# Patient Record
Sex: Female | Born: 1970
Health system: Southern US, Community
[De-identification: ages and names within clinical notes are randomized; demographics above are authoritative.]

---

## 1999-02-20 ENCOUNTER — Emergency Department (HOSPITAL_COMMUNITY): Admission: EM | Admit: 1999-02-20 | Discharge: 1999-02-20 | Payer: Self-pay | Admitting: Emergency Medicine

## 2001-10-06 ENCOUNTER — Encounter: Payer: Self-pay | Admitting: Emergency Medicine

## 2001-10-06 ENCOUNTER — Encounter: Admission: RE | Admit: 2001-10-06 | Discharge: 2001-10-06 | Payer: Self-pay | Admitting: Emergency Medicine

## 2003-08-17 ENCOUNTER — Emergency Department (HOSPITAL_COMMUNITY): Admission: EM | Admit: 2003-08-17 | Discharge: 2003-08-18 | Payer: Self-pay | Admitting: Emergency Medicine

## 2003-08-18 ENCOUNTER — Encounter: Payer: Self-pay | Admitting: Emergency Medicine

## 2008-12-14 ENCOUNTER — Encounter (INDEPENDENT_AMBULATORY_CARE_PROVIDER_SITE_OTHER): Payer: Self-pay | Admitting: Emergency Medicine

## 2008-12-14 ENCOUNTER — Emergency Department (HOSPITAL_COMMUNITY): Admission: EM | Admit: 2008-12-14 | Discharge: 2008-12-14 | Payer: Self-pay | Admitting: Emergency Medicine

## 2008-12-14 ENCOUNTER — Ambulatory Visit: Payer: Self-pay | Admitting: Vascular Surgery

## 2009-11-13 ENCOUNTER — Emergency Department (HOSPITAL_COMMUNITY): Admission: EM | Admit: 2009-11-13 | Discharge: 2009-11-13 | Payer: Self-pay | Admitting: Family Medicine

## 2011-02-20 LAB — POCT I-STAT, CHEM 8
BUN: 10 mg/dL (ref 6–23)
Calcium, Ion: 1.22 mmol/L (ref 1.12–1.32)
Chloride: 106 mEq/L (ref 96–112)
Creatinine, Ser: 0.9 mg/dL (ref 0.4–1.2)
Glucose, Bld: 88 mg/dL (ref 70–99)
HCT: 45 % (ref 36.0–46.0)
Hemoglobin: 15.3 g/dL — ABNORMAL HIGH (ref 12.0–15.0)
Potassium: 3.7 mEq/L (ref 3.5–5.1)
Sodium: 143 mEq/L (ref 135–145)
TCO2: 28 mmol/L (ref 0–100)

## 2012-02-26 ENCOUNTER — Other Ambulatory Visit (HOSPITAL_COMMUNITY): Payer: Self-pay | Admitting: Obstetrics & Gynecology

## 2012-02-26 DIAGNOSIS — Z1231 Encounter for screening mammogram for malignant neoplasm of breast: Secondary | ICD-10-CM

## 2012-03-24 ENCOUNTER — Other Ambulatory Visit (HOSPITAL_COMMUNITY): Payer: Self-pay | Admitting: Family Medicine

## 2012-03-24 ENCOUNTER — Other Ambulatory Visit: Payer: Self-pay | Admitting: Internal Medicine

## 2012-03-24 ENCOUNTER — Ambulatory Visit (HOSPITAL_COMMUNITY)
Admission: RE | Admit: 2012-03-24 | Discharge: 2012-03-24 | Disposition: A | Discharge status: Home / Self Care | Payer: Self-pay | Source: Ambulatory Visit | Attending: Obstetrics & Gynecology | Admitting: Obstetrics & Gynecology

## 2012-03-24 DIAGNOSIS — Z1231 Encounter for screening mammogram for malignant neoplasm of breast: Secondary | ICD-10-CM

## 2012-11-26 IMAGING — MG MM DIGITAL SCREENING BILAT W/ CAD
5 series · 5 of 5 positions shown · non-contrast
Comparison: none

CLINICAL DATA: Screening.

MAMMOGRAPHIC BILATERAL DIGITAL SCREENING WITH CAD

[R CC (1 of 2)]
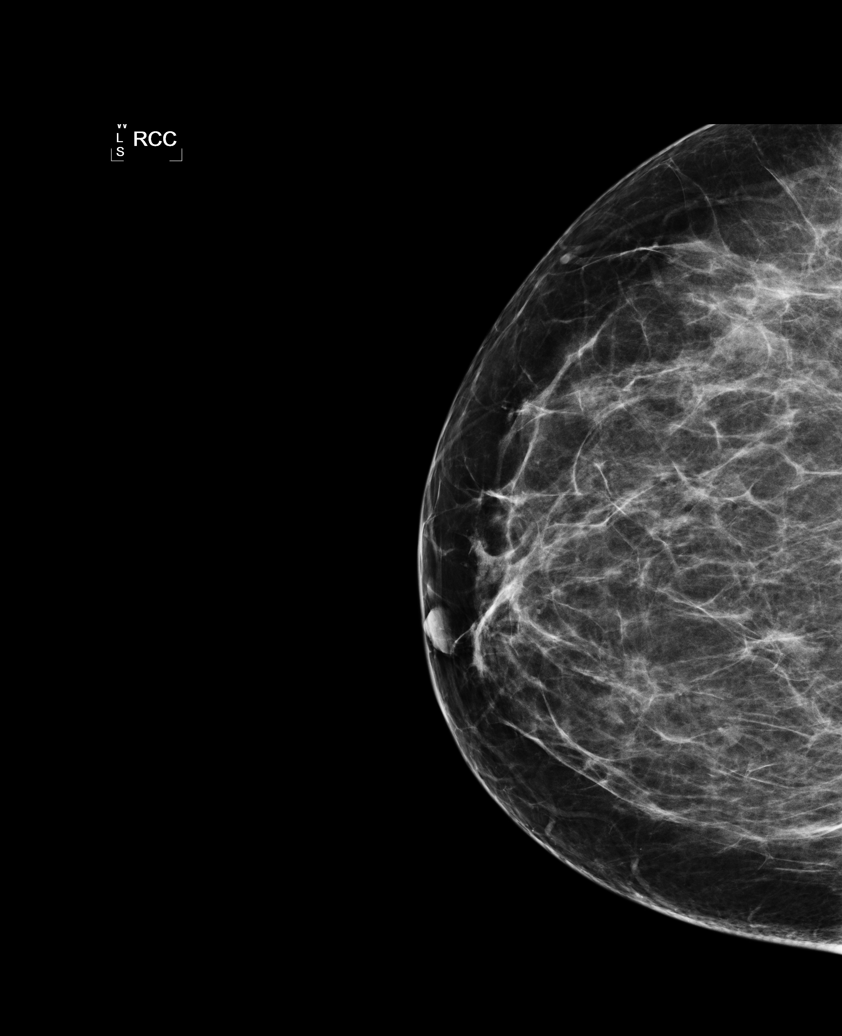

[R MLO]
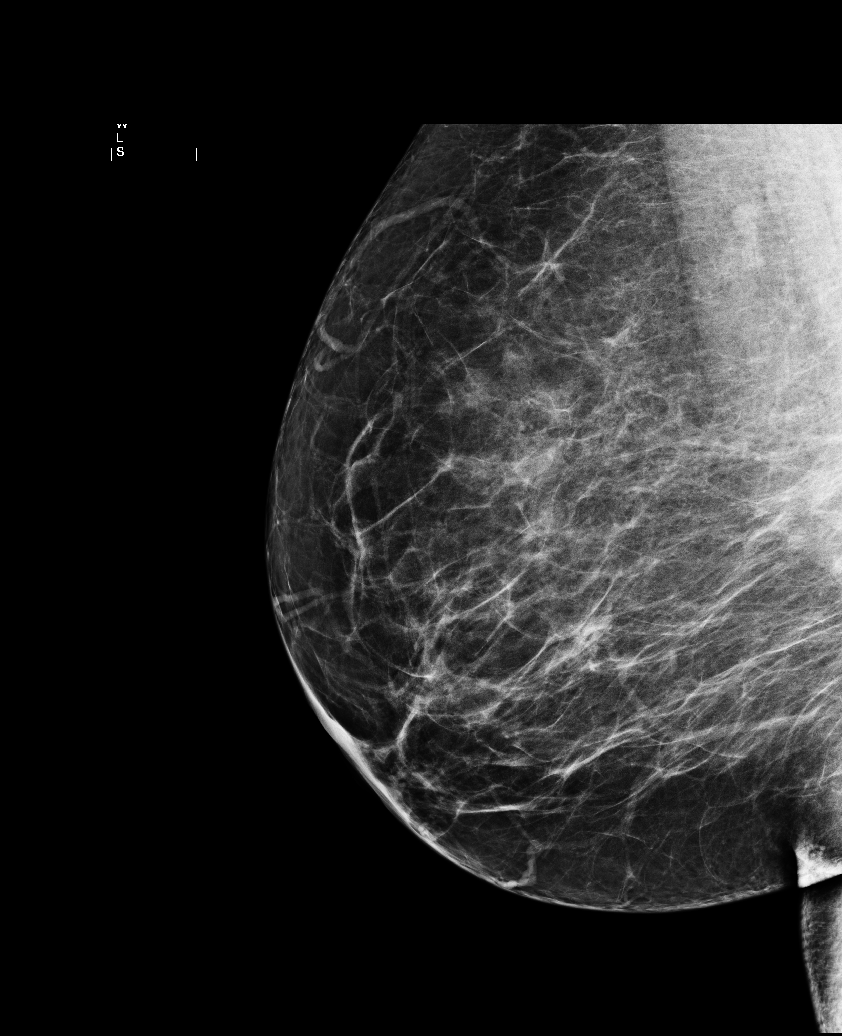

[L CC]
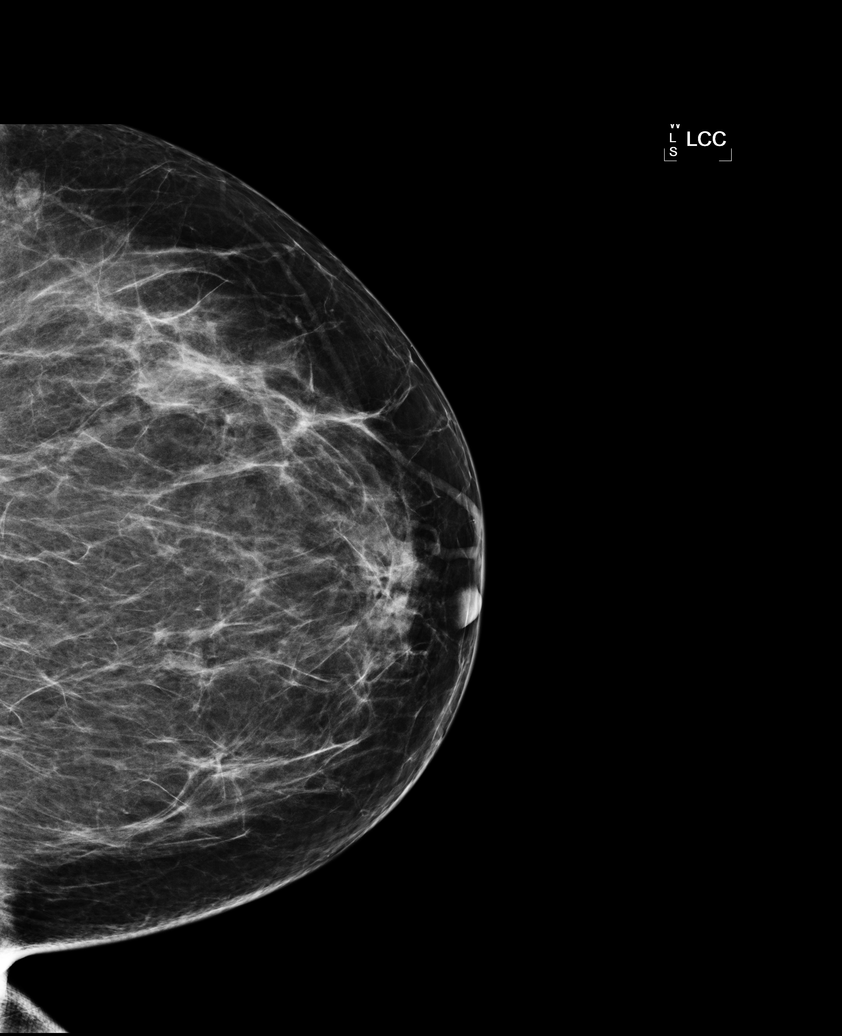

[L MLO]
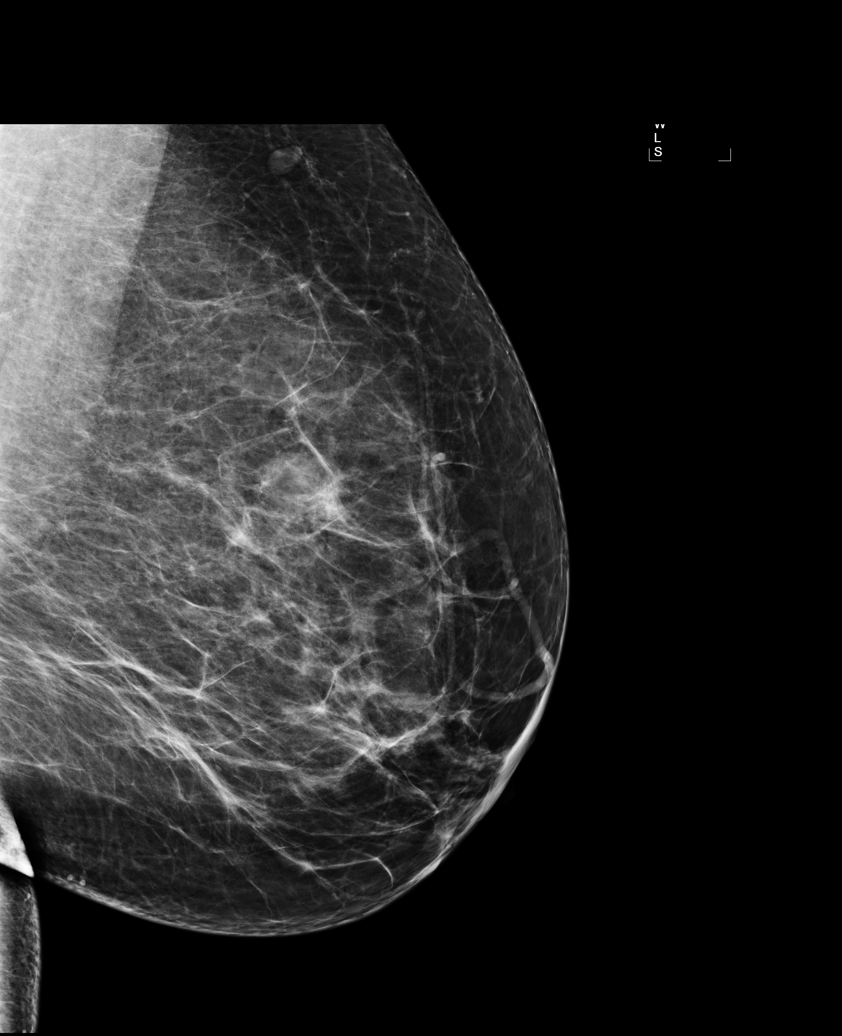

[R CC (2 of 2)]
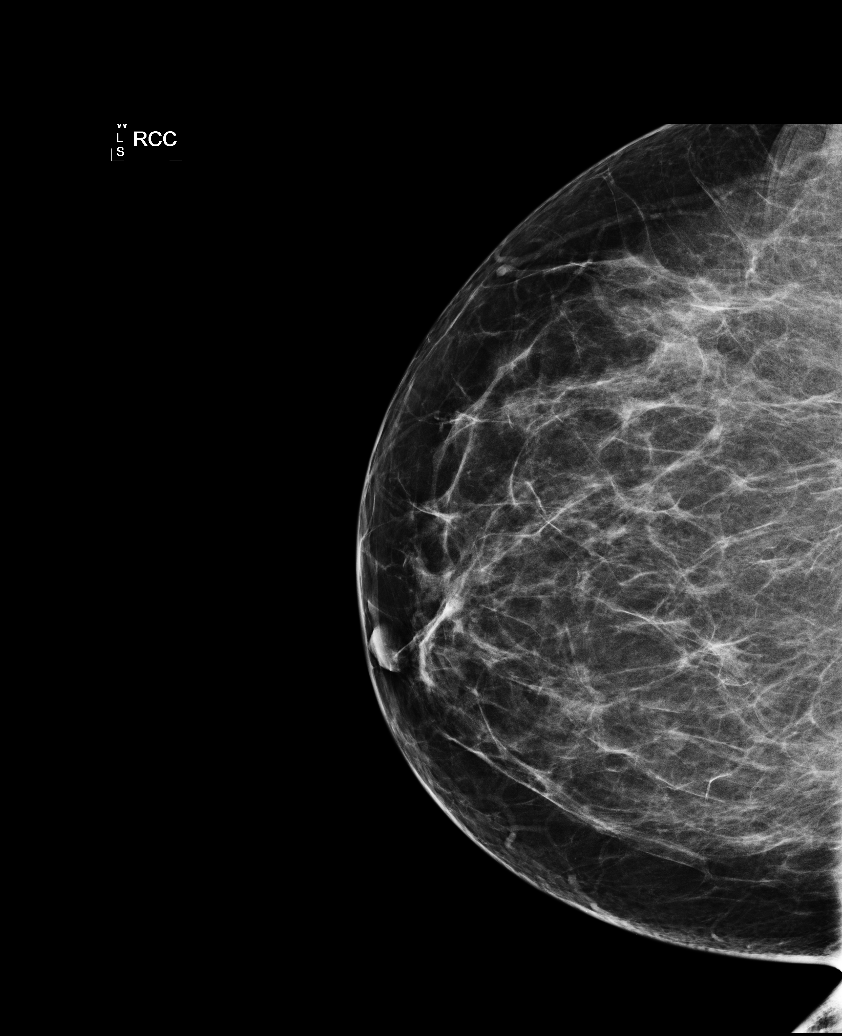

[5 of 5 positions shown; findings below may reference images not displayed]

FINDINGS: There are scattered fibroglandular densities.  No masses
or malignant type calcifications are identified.

Images were processed with CAD.
IMPRESSION: No specific mammographic evidence of malignancy.

A result letter of this screening mammogram will be mailed directly
to the patient.

RECOMMENDATION:
Screening mammogram in one year. (Code:[id number])

BI-RADS CATEGORY 1:  Negative

## 2013-08-11 ENCOUNTER — Emergency Department (INDEPENDENT_AMBULATORY_CARE_PROVIDER_SITE_OTHER)
Admission: EM | Admit: 2013-08-11 | Discharge: 2013-08-11 | Disposition: A | Discharge status: Home / Self Care | Payer: Self-pay | Source: Home / Self Care | Attending: Family Medicine | Admitting: Family Medicine

## 2013-08-11 ENCOUNTER — Encounter (HOSPITAL_COMMUNITY): Payer: Self-pay | Admitting: Emergency Medicine

## 2013-08-11 ENCOUNTER — Emergency Department (INDEPENDENT_AMBULATORY_CARE_PROVIDER_SITE_OTHER): Payer: Self-pay

## 2013-08-11 DIAGNOSIS — J40 Bronchitis, not specified as acute or chronic: Secondary | ICD-10-CM

## 2013-08-11 MED ORDER — BENZONATATE 100 MG PO CAPS: 100.0000 mg | ORAL_CAPSULE | Freq: Three times a day (TID) | ORAL | Status: DC

## 2013-08-11 MED ORDER — AZITHROMYCIN 250 MG PO TABS: 250.0000 mg | ORAL_TABLET | Freq: Every day | ORAL | Status: DC

## 2013-08-11 NOTE — ED Provider Notes (Signed)
CSN: [id number]     Arrival date & time 08/11/13  1005 History   First MD Initiated Contact with Patient 08/11/13 1111     Chief Complaint  Patient presents with  . Cough   (Consider location/radiation/quality/duration/timing/severity/associated sxs/prior Treatment) Patient is a 42 y.o. female presenting with cough. The history is provided by the patient. No language interpreter was used.  Cough Cough characteristics:  Productive Sputum characteristics:  Clear Severity:  Moderate Onset quality:  Gradual Duration:  2 weeks Timing:  Constant Progression:  Worsening Chronicity:  New Smoker: yes   Relieved by:  Nothing Worsened by:  Nothing tried Ineffective treatments:  None tried Associated symptoms: no chest pain and no fever   Risk factors: no recent infection     History reviewed. No pertinent past medical history. No past surgical history on file. No family history on file. History  Substance Use Topics  . Smoking status: Not on file  . Smokeless tobacco: Not on file  . Alcohol Use: Not on file   OB History   Grav Para Term Preterm Abortions TAB SAB Ect Mult Living                 Review of Systems  Constitutional: Negative for fever.  Respiratory: Positive for cough.   Cardiovascular: Negative for chest pain.  All other systems reviewed and are negative.    Allergies  Review of patient's allergies indicates no known allergies.  Home Medications  No current outpatient prescriptions on file. BP 145/81  Pulse 65  Temp(Src) 98.9 F (37.2 C) (Oral)  Resp 20  SpO2 100%  LMP 08/08/2013 Physical Exam  Nursing note and vitals reviewed. Constitutional: She is oriented to person, place, and time. She appears well-developed and well-nourished.  HENT:  Head: Normocephalic.  Right Ear: External ear normal.  Left Ear: External ear normal.  Nose: Nose normal.  Mouth/Throat: Oropharynx is clear and moist.  Eyes: Pupils are equal, round, and reactive to light.   Neck: Normal range of motion.  Cardiovascular: Normal rate and normal heart sounds.   Pulmonary/Chest: Effort normal and breath sounds normal.  Abdominal: She exhibits no distension.  Musculoskeletal: Normal range of motion.  Neurological: She is alert and oriented to person, place, and time.  Skin: Skin is warm.  Psychiatric: She has a normal mood and affect.    ED Course  Procedures (including critical care time) Labs Review Labs Reviewed - No data to display Imaging Review No results found.  MDM   1. Bronchitis    Zithromax,   pessalon perles.  Return if symptoms persist   Elson Areas, [place] 08/11/13 1259

## 2013-08-11 NOTE — ED Notes (Signed)
C/o chronic cough. States she does have allergies that has her congested.  Robitussin used but no relief.

## 2013-08-12 NOTE — ED Provider Notes (Signed)
Medical screening examination/treatment/procedure(s) were performed by resident physician or non-physician practitioner and as supervising physician I was immediately available for consultation/collaboration.   Gokul Waybright DOUGLAS MD.   Cartrell Bentsen D Rollins Wrightson, MD 08/12/13 2107 

## 2013-08-14 NOTE — ED Notes (Signed)
Patient here, wants to talk to nurse

## 2013-10-22 ENCOUNTER — Emergency Department (INDEPENDENT_AMBULATORY_CARE_PROVIDER_SITE_OTHER)
Admission: EM | Admit: 2013-10-22 | Discharge: 2013-10-22 | Disposition: A | Discharge status: Home / Self Care | Payer: Self-pay | Source: Home / Self Care | Attending: Family Medicine | Admitting: Family Medicine

## 2013-10-22 ENCOUNTER — Encounter (HOSPITAL_COMMUNITY): Payer: Self-pay | Admitting: Emergency Medicine

## 2013-10-22 DIAGNOSIS — J069 Acute upper respiratory infection, unspecified: Secondary | ICD-10-CM

## 2013-10-22 MED ORDER — METHYLPREDNISOLONE 4 MG PO KIT: PACK | ORAL | Status: DC

## 2013-10-22 MED ORDER — HYDROCODONE-HOMATROPINE 5-1.5 MG/5ML PO SYRP: 5.0000 mL | ORAL_SOLUTION | Freq: Four times a day (QID) | ORAL | Status: DC | PRN

## 2013-10-22 MED ORDER — LEVOFLOXACIN 500 MG PO TABS: 500.0000 mg | ORAL_TABLET | Freq: Every day | ORAL | Status: DC

## 2013-10-22 NOTE — ED Provider Notes (Signed)
CSN: [id number]     Arrival date & time 10/22/13  1935 History   First MD Initiated Contact with Patient 10/22/13 2031     Chief Complaint  Patient presents with  . Cough   (Consider location/radiation/quality/duration/timing/severity/associated sxs/prior Treatment) HPI Comments: 42 year old female presents complaining of subjective fever and dry, nonproductive cough that began yesterday. She notes that she was seen here back in October and was treated for bronchitis with azithromycin and Tessalon Perles. She got significantly better, however the cough never completely went away. She said she was only coughing up about twice per day up until when she started to get sick again 2 days ago. She has some pain in her chest only when she coughs, as well as sore throat and headache when she coughs. She is not short of breath and is not having any pleuritic chest pain. No leg swelling or recent travel. She does have sick contacts at home. She did not get her flu shot this year   History reviewed. No pertinent past medical history. Past Surgical History  Procedure Laterality Date  . Cesarean section  1991   History reviewed. No pertinent family history. History  Substance Use Topics  . Smoking status: Current Every Day Smoker -- 0.05 packs/day    Types: Cigarettes  . Smokeless tobacco: Not on file  . Alcohol Use: Not on file   OB History   Grav Para Term Preterm Abortions TAB SAB Ect Mult Living                 Review of Systems  Constitutional: Positive for fever and chills.  HENT: Positive for congestion, rhinorrhea and sore throat. Negative for ear pain and sinus pressure.   Eyes: Negative for visual disturbance.  Respiratory: Positive for cough. Negative for shortness of breath.   Cardiovascular: Positive for chest pain (with coughing only). Negative for palpitations and leg swelling.  Gastrointestinal: Negative for nausea, vomiting and abdominal pain.  Endocrine: Negative for  polydipsia and polyuria.  Genitourinary: Negative for dysuria, urgency and frequency.  Musculoskeletal: Negative for arthralgias and myalgias.  Skin: Negative for rash.  Neurological: Positive for headaches. Negative for dizziness, weakness and light-headedness.    Allergies  Review of patient's allergies indicates no known allergies.  Home Medications   Current Outpatient Rx  Name  Route  Sig  Dispense  Refill  . azithromycin (ZITHROMAX) 250 MG tablet   Oral   Take 1 tablet (250 mg total) by mouth daily. Take first 2 tablets together, then 1 every day until finished.   6 tablet   0   . benzonatate (TESSALON) 100 MG capsule   Oral   Take 1 capsule (100 mg total) by mouth every 8 (eight) hours.   21 capsule   0   . HYDROcodone-homatropine (HYCODAN) 5-1.5 MG/5ML syrup   Oral   Take 5 mLs by mouth every 6 (six) hours as needed for cough.   120 mL   0   . levofloxacin (LEVAQUIN) 500 MG tablet   Oral   Take 1 tablet (500 mg total) by mouth daily.   7 tablet   0   . methylPREDNISolone (MEDROL DOSEPAK) 4 MG tablet      follow package directions   21 tablet   0     Dispense as written.    BP 165/87  Pulse 77  Temp(Src) 100.9 F (38.3 C) (Oral)  Resp 20  SpO2 100%  LMP 10/12/2013 Physical Exam  Nursing note and vitals  reviewed. Constitutional: She is oriented to person, place, and time. Vital signs are normal. She appears well-developed and well-nourished. No distress.  HENT:  Head: Normocephalic and atraumatic.  Nose: Nose normal.  Mouth/Throat: Oropharynx is clear and moist. No oropharyngeal exudate.  Eyes: Conjunctivae are normal. Right eye exhibits no discharge. Left eye exhibits no discharge.  Neck: Normal range of motion. Neck supple. No JVD present.  Cardiovascular: Normal rate, regular rhythm and normal heart sounds.  Exam reveals no gallop and no friction rub.   No murmur heard. Pulmonary/Chest: Effort normal and breath sounds normal. No respiratory  distress. She has no wheezes. She has no rales.  Abdominal: Soft.  Lymphadenopathy:    She has no cervical adenopathy.  Neurological: She is alert and oriented to person, place, and time. She has normal strength. Coordination normal.  Skin: Skin is warm and dry. No rash noted. She is not diaphoretic.  Psychiatric: She has a normal mood and affect. Judgment normal.    ED Course  Procedures (including critical care time) Labs Review Labs Reviewed - No data to display Imaging Review No results found.    MDM   1. URI (upper respiratory infection)    Physical exam is normal, low-grade fever but vitals are otherwise normal. I believe she has another viral URI, I do not believe this is the same illness getting worse. Treat symptomatically, however she is given a prescription for antibiotics to start taking in 2 days if she has any worsening or she is not beginning to improve. She is agreeable with this plan.  Discharge Medication List as of 10/22/2013  8:48 PM    START taking these medications   Details  HYDROcodone-homatropine (HYCODAN) 5-1.5 MG/5ML syrup Take 5 mLs by mouth every 6 (six) hours as needed for cough., Starting 10/22/2013, Until Discontinued, Print    levofloxacin (LEVAQUIN) 500 MG tablet Take 1 tablet (500 mg total) by mouth daily., Starting 10/22/2013, Until Discontinued, Print    methylPREDNISolone (MEDROL DOSEPAK) 4 MG tablet follow package directions, Print         Kathy Good, PA-C 10/23/13 1008

## 2013-10-22 NOTE — ED Notes (Signed)
C/o coughing, sneezing and chest congestion onset 11/2.  Was seen here on 11/7 and diagnosed with Bronchitis was given a Z-pack and Tessalon perles.  She came back a week later and nurse talked to her.  Symptoms decreased but never went away.  C/o pain in her chest when she coughs and gets a headache when she coughs. Tried Delsym without relief.  She said she felt feverish this afternoon and has been getting chills since yesterday.

## 2013-10-23 NOTE — ED Provider Notes (Signed)
Medical screening examination/treatment/procedure(s) were performed by resident physician or non-physician practitioner and as supervising physician I was immediately available for consultation/collaboration.   Ondra Deboard DOUGLAS MD.   Marvel Mcphillips D Coyle Stordahl, MD 10/23/13 1652 

## 2014-04-15 IMAGING — CR DG CHEST 2V
2 series · 2 of 2 positions shown · non-contrast
Comparison: 11/13/2009

CLINICAL DATA: Cough for 12 days

EXAM:
CHEST  2 VIEW

[view not recorded (1 of 2)]
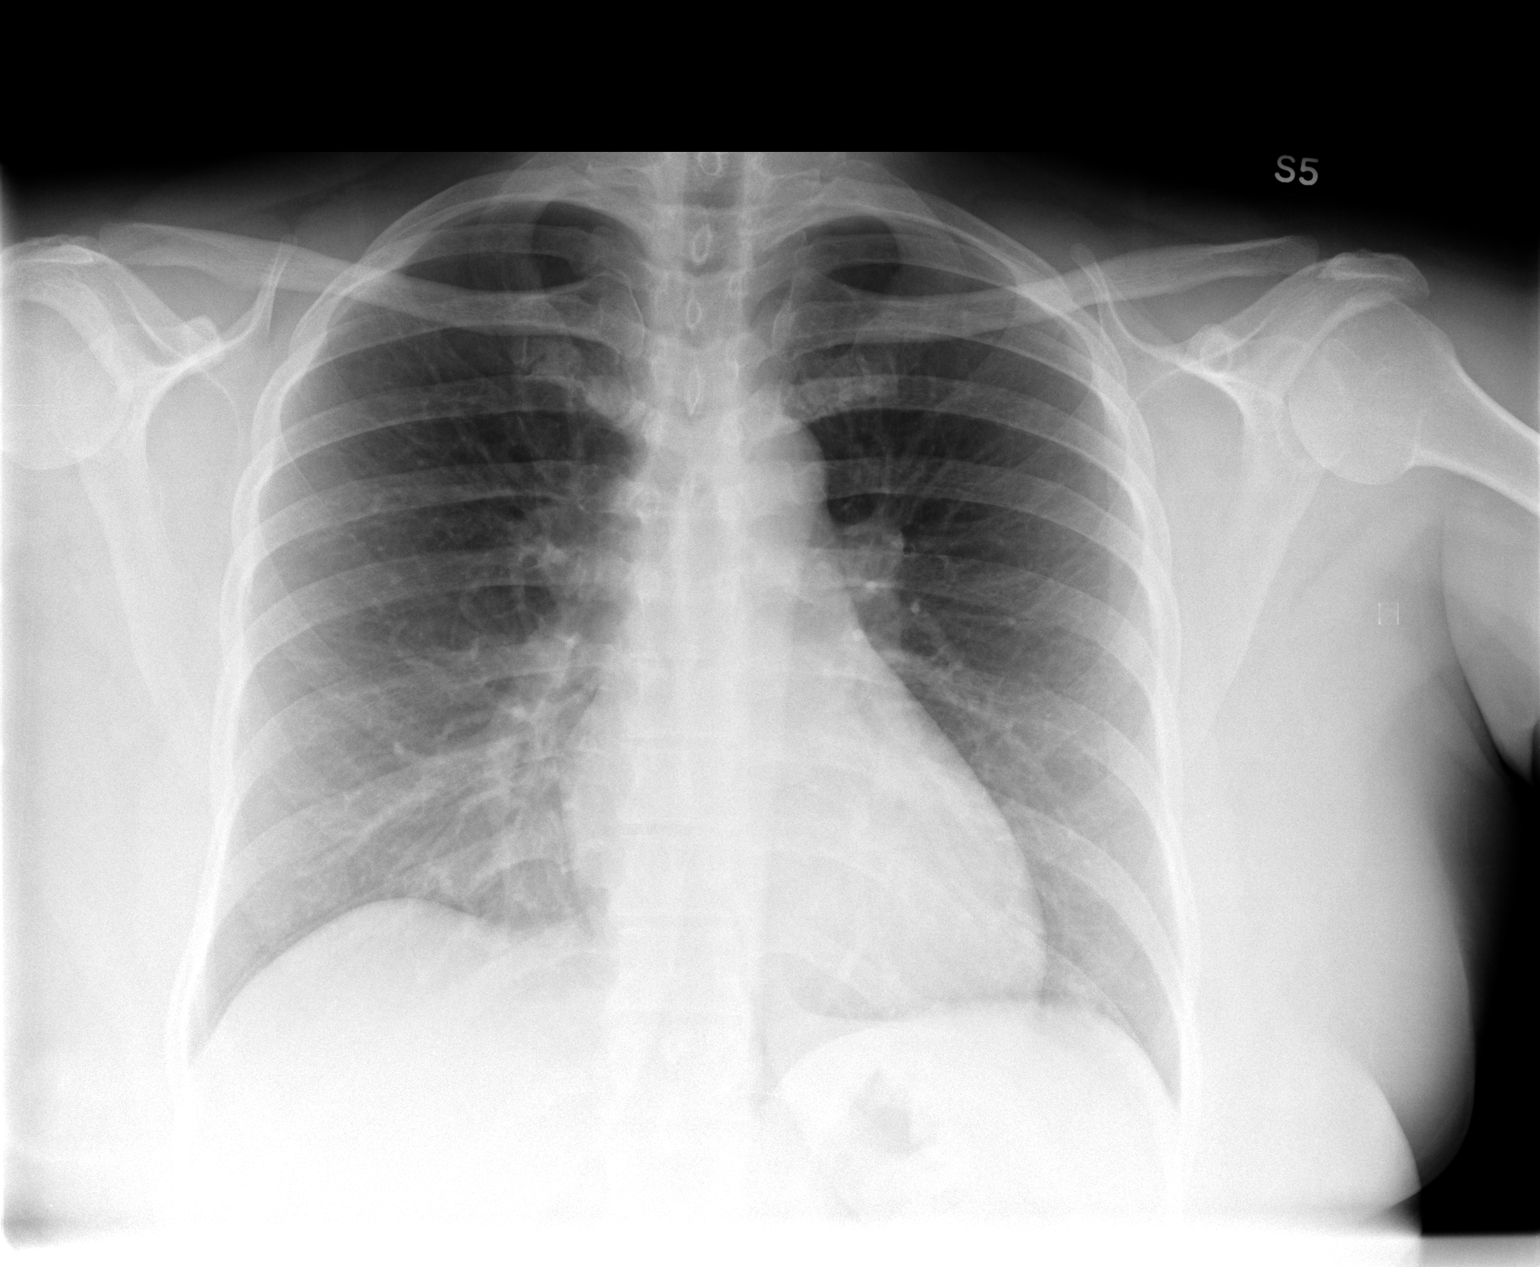

[view not recorded (2 of 2)]
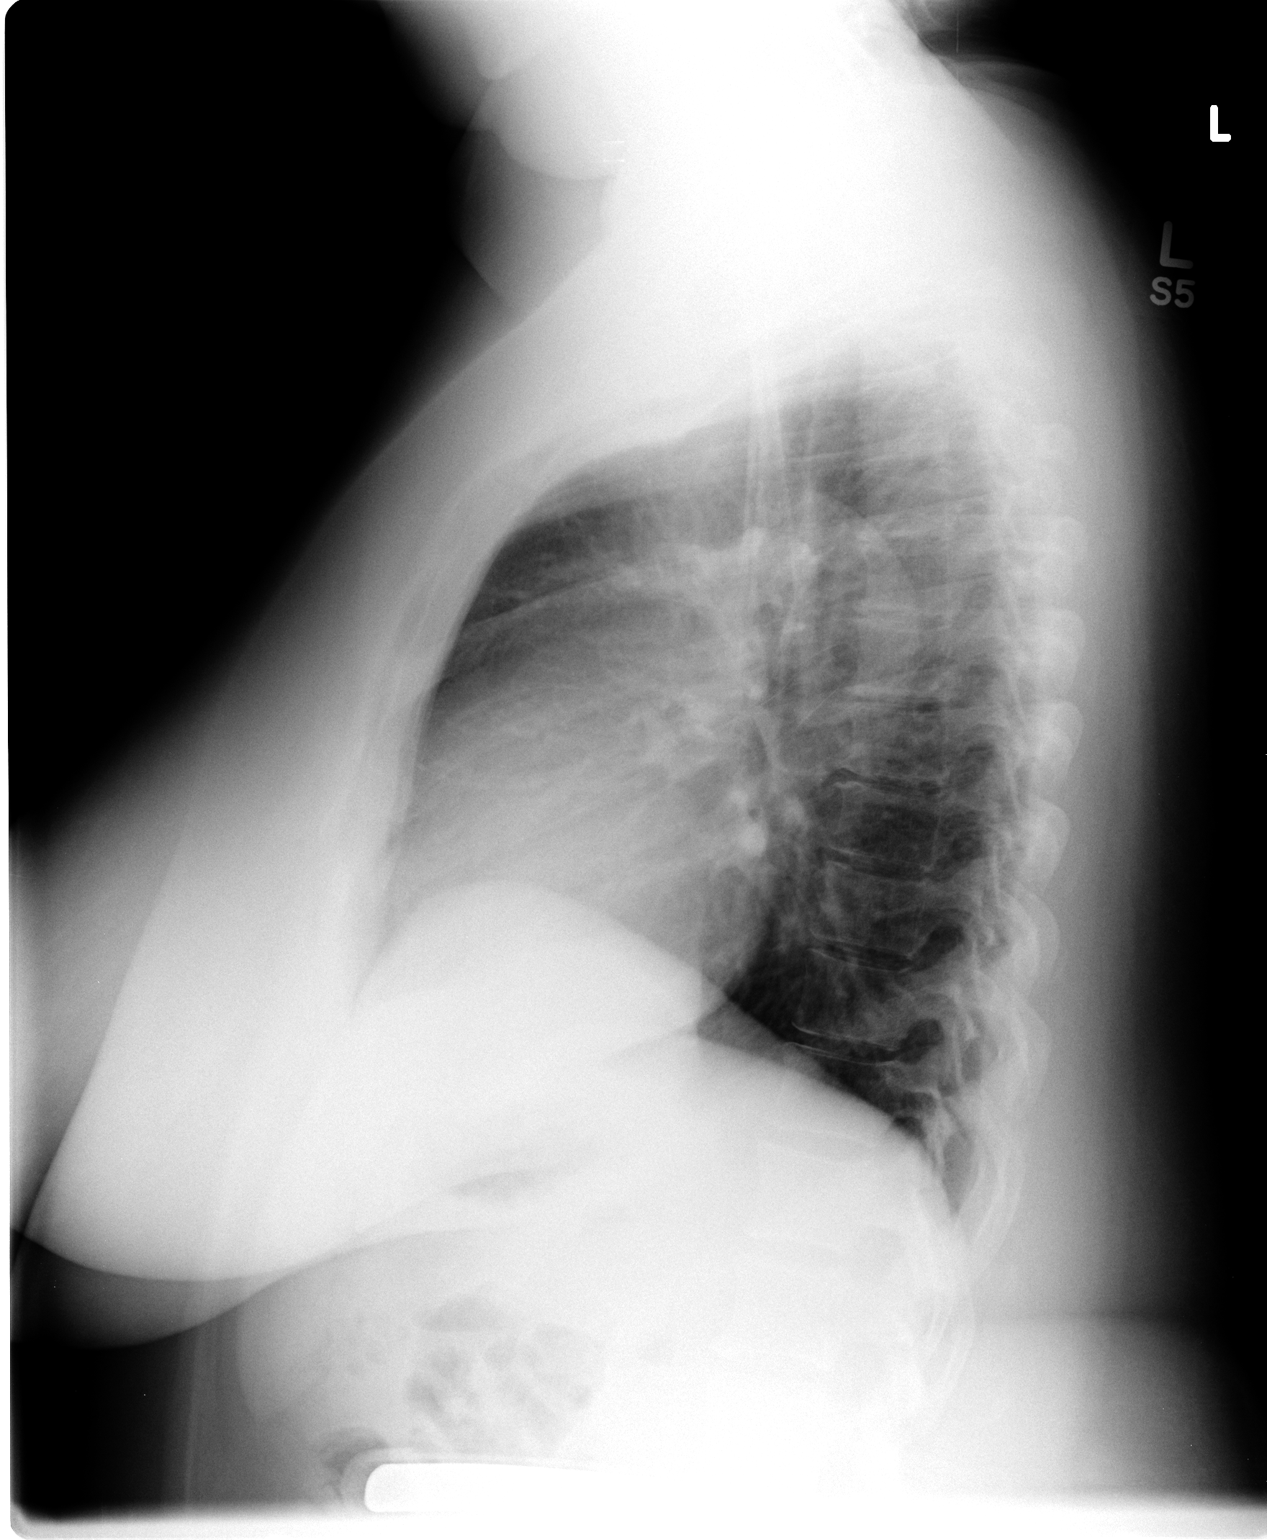

[2 of 2 positions shown; findings below may reference images not displayed]

FINDINGS: Cardiomediastinal silhouette is stable. No acute infiltrate or
pulmonary edema. Minimal central bronchitic changes. Bony thorax is
stable.
IMPRESSION: No acute infiltrate or pulmonary edema. Minimal central bronchitic
changes.

## 2015-01-10 ENCOUNTER — Encounter (HOSPITAL_COMMUNITY): Payer: Self-pay | Admitting: Emergency Medicine

## 2015-01-10 ENCOUNTER — Emergency Department (INDEPENDENT_AMBULATORY_CARE_PROVIDER_SITE_OTHER)
Admission: EM | Admit: 2015-01-10 | Discharge: 2015-01-10 | Disposition: A | Discharge status: Home / Self Care | Payer: Self-pay | Source: Home / Self Care | Attending: Emergency Medicine | Admitting: Emergency Medicine

## 2015-01-10 DIAGNOSIS — M76899 Other specified enthesopathies of unspecified lower limb, excluding foot: Secondary | ICD-10-CM

## 2015-01-10 DIAGNOSIS — M6580 Other synovitis and tenosynovitis, unspecified site: Secondary | ICD-10-CM

## 2015-01-10 LAB — D-DIMER, QUANTITATIVE (NOT AT ARMC)

## 2015-01-10 MED ORDER — IBUPROFEN 800 MG PO TABS
ORAL_TABLET | ORAL | Status: AC
  Filled 2015-01-10: qty 1

## 2015-01-10 MED ORDER — IBUPROFEN 800 MG PO TABS
800.0000 mg | ORAL_TABLET | Freq: Once | ORAL | Status: AC
  Administered 2015-01-10: 800 mg via ORAL

## 2015-01-10 MED ORDER — DICLOFENAC SODIUM 75 MG PO TBEC: 75.0000 mg | DELAYED_RELEASE_TABLET | Freq: Two times a day (BID) | ORAL | Status: DC

## 2015-01-10 MED ORDER — ACETAMINOPHEN 325 MG PO TABS
650.0000 mg | ORAL_TABLET | Freq: Once | ORAL | Status: AC
  Administered 2015-01-10: 650 mg via ORAL

## 2015-01-10 MED ORDER — HYDROCODONE-ACETAMINOPHEN 5-325 MG PO TABS: ORAL_TABLET | ORAL | Status: DC

## 2015-01-10 MED ORDER — ACETAMINOPHEN 325 MG PO TABS
ORAL_TABLET | ORAL | Status: AC
  Filled 2015-01-10: qty 2

## 2015-01-10 NOTE — Discharge Instructions (Signed)
Knee pain can be caused by many conditions:  Osteoarthritis, gout, bursitis, tendonitis, cartiledge damage, condromalacia patella, patellofemoral syndrome, and ligament sprain to name just a few.  Often some simple conservative measures can help alleviate the pain. ° °Do not do the following: °· Avoid squatting and doing deep knee bends.  This puts too much of load on your cartiledges and tendons.  If you do a knee bend, go only half way down, flexing your knee no more than 90 degrees. ° °Do the following: °· If you are overweight or obese, lose weight.  This makes for a lot less load on your knee joints. °· If you use tobacco, quit.  Nicotine causes spasm of the small arteries, decreases blood flow, and impairs your body's normal ability to repair damage. °· If your knee is acutely inflamed, use the principles of RICE (rest, ice, compression, and elevation). °· Wearing a knee brace can help.  These are usually made of neoprene and can be purchased over the counter at the drug store. °· Use of over the counter pain meds can be of help.  Tylenol (or acetaminophen) is the safest to use.  It often helps to take this regularly.  You can take up to 2 325 mg tablets 5 times daily, but it best to start out much lower that that, perhaps 2 325 mg tablets twice daily, then increase from there. People who are on the blood thinner warfarin have to be careful about taking high doses of Tylenol.  For people who are able to tolerate them, ibuprofen and naproxyn can also help with the pain.  You should discuss these agents with your physician before taking them.  People with chronic kidney disease, hypertension, peptic ulcer disease, and reflux can suffer adverse side effects. They should not be taken with warfarin. The maximum dosage of ibuprofen is 800 mg 3 times daily with meals.  The maximum dosage of naprosyn is 2 and 1/2 tablets twice daily with food, but again, start out low and gradually increase the dose until adequate  pain relief is achieved. Ibuprofen and naprosyn should always be taken with food. °· People with cartiledge injury or osteoarthritis may find glucosamine to be helpful.  This is an over-the-counter supplement that helps nourish and repair cartiledge.  The dose is 500 mg 3 times daily or 1500 mg taken in a single dose. This can take several months to work and it doesn't always work.   °· For people with knee pain on just one side, use of a cane held in the hand on the same side as the knee pain takes some of the stress off the knee joint and can make a big difference in knee pain. °· Wearing good shoes with adequate arch support is essential. °· Regular exercise is of utmost importance.  Swimming, water aerobics, or use of an elliptical exerciser put the least stress on the knees of any exercise. °· Finally doing the exercises below can be very helpful.  They tend to strengthen the muscles around the knee and provide extra support and stability.  Try to do them twice a day followed by ice for 10 minutes. ° ° ° ° ° ° °

## 2015-01-10 NOTE — ED Notes (Signed)
Patient c/o right leg pain specifically around her knee onset yesterday. Patient reports last week she was walking a bit more than usual for exercise. Patient is in NAD. Feels better when leg is propped up but she gets stiff.

## 2015-01-10 NOTE — ED Provider Notes (Signed)
Chief Complaint   Leg Pain   History of Present Illness   Kathy Campbell is a 44 year old female who has had a 2 day history of pain in her right knee. The pain is located mostly medially and posteriorly. There was not much anterior pain. She denies any injury to the knee. There is no visible swelling. She has been walking a little bit more than usual. The patient states it hurts to walk, to stand, or even to sit. She denies any locking, catching, popping, or giving way of the joint. There is no swelling of the calf and ankle. She denies any history of DVT in the past or thrombophlebitis. She's not taking estrogens. No immobilization or prolonged car or plane trips. She does not have any history of cancer or tumors. She has not had any recent history of surgery.  Review of Systems   Other than as noted above, the patient denies any of the following symptoms: Systemic:  No fevers or chills. Musculoskeletal:  No arthritis, swelling, or joint pain.  Neurological:  No muscular weakness or paresthesias.  PMFSH   Past medical history, family history, social history, meds, and allergies were reviewed.     Physical Examination   Vital signs:  BP 153/90 mmHg  Pulse 67  Temp(Src) 97.9 F (36.6 C) (Oral)  Resp 16  SpO2 98%  LMP 01/03/2015 Gen:  Alert and oriented times 3.  In no distress. Musculoskeletal: There is no swelling or joint effusion. She does have some pain over the medial joint line and in the popliteal fossa, extending down into the proximal gastrocnemius. The knee itself has a somewhat limited range of motion with only about 90 of flexion with pain. There is no crepitus.   McMurray's test was negative.  Lachman's test was negative.  Anterior drawer test was negative.   Varus and valgus stress negative.  Able to do a straight leg raise. Otherwise, all joints had a full a ROM with no swelling, bruising or deformity.  No edema, pulses full. Extremities were warm and pink.   Capillary refill was brisk. There was moderate calf tenderness, Homans sign was negative. There was no calf swelling and no pitting edema. The entire leg was not swollen. There were no collateral superficial veins present and no localized tenderness along with deep venous system. Skin:  Clear, warm and dry.  No rash. Neuro:  Alert and oriented times 3.  Muscle strength was normal.  Sensation was intact to light touch.    Labs   Results for orders placed or performed during the hospital encounter of 01/10/15  D-dimer, quantitative  Result Value Ref Range   D-Dimer, Quant <0.27 0.00 - 0.48 ug/mL-FEU   Course in Urgent Care Center   The following medications were given:  Medications  ibuprofen (ADVIL,MOTRIN) tablet 800 mg (800 mg Oral Given 01/10/15 1136)  acetaminophen (TYLENOL) tablet 650 mg (650 mg Oral Given 01/10/15 1139)    The patient was placed in a knee sleeve.  Assessment   The encounter diagnosis was Knee tendonitis.  No evidence of deep venous thrombophlebitis.  Plan    1.  Meds:  The following meds were prescribed:   Discharge Medication List as of 01/10/2015 12:45 PM    START taking these medications   Details  diclofenac (VOLTAREN) 75 MG EC tablet Take 1 tablet (75 mg total) by mouth 2 (two) times daily., Starting 01/10/2015, Until Discontinued, Normal    HYDROcodone-acetaminophen (NORCO/VICODIN) 5-325 MG per tablet 1  to 2 tabs every 4 to 6 hours as needed for pain., Print        2.  Patient Education/Counseling:  The patient was given appropriate handouts, self care instructions, and instructed in symptomatic relief, including rest and activity, elevation, application of ice and compression.  Complete rest for about 3 days with icing, thereafter can begin some gentle stretching exercises. If no better in 2 weeks follow up with orthopedics.  3.  Follow up:  The patient was told to follow up here if no better in 3 to 4 days, or sooner if becoming worse in any way, and  given some red flag symptoms such as worsening pain or neurological symptoms which would prompt immediate return.       Reuben Likes, MD 01/10/15 [phone]

## 2016-06-11 ENCOUNTER — Ambulatory Visit: Payer: Self-pay | Admitting: Family Medicine

## 2016-06-13 ENCOUNTER — Encounter: Payer: Self-pay | Admitting: Obstetrics & Gynecology

## 2016-06-13 ENCOUNTER — Ambulatory Visit (INDEPENDENT_AMBULATORY_CARE_PROVIDER_SITE_OTHER): Payer: Self-pay | Admitting: Obstetrics & Gynecology

## 2016-06-13 VITALS — BP 134/99 | HR 62 | Ht 61.0 in | Wt 207.5 lb

## 2016-06-13 DIAGNOSIS — Z113 Encounter for screening for infections with a predominantly sexual mode of transmission: Secondary | ICD-10-CM

## 2016-06-13 DIAGNOSIS — N898 Other specified noninflammatory disorders of vagina: Secondary | ICD-10-CM

## 2016-06-13 DIAGNOSIS — L298 Other pruritus: Secondary | ICD-10-CM

## 2016-06-13 DIAGNOSIS — Z202 Contact with and (suspected) exposure to infections with a predominantly sexual mode of transmission: Secondary | ICD-10-CM

## 2016-06-13 MED ORDER — AZITHROMYCIN 250 MG PO TABS: ORAL_TABLET | ORAL | 0 refills | Status: DC

## 2016-06-13 MED ORDER — METRONIDAZOLE 500 MG PO TABS: 500.0000 mg | ORAL_TABLET | Freq: Two times a day (BID) | ORAL | 0 refills | Status: DC

## 2016-06-13 NOTE — Progress Notes (Signed)
   Subjective:    Patient ID: Kathy Campbell, female    DOB: 05-31-1971, 45 y.o.   MRN: [id number]  HPI  45 yo S AA P1 3(44 yo daughter) here today because her partner notified her recently that he was treated for CT. She is having some vaginal itching. She uses condoms but not for the entire sex occasion. She doesn't plan to have sex with this fellow again.  Review of Systems Last pap 4/17, normal, [facility] Had blood STI testing there and declines it today.    Objective:   Physical Exam [id number] Breathing, conversing, and ambulating normally Abd- benign Speculum exam c/w BV      Assessment & Plan:  Vaginal itching- check wet prep Exposure to STI Will check CT/GC. Offered prophylac treatment with zithromax BV- treat with flagyl

## 2016-06-13 NOTE — Addendum Note (Signed)
Addended by: Sherre LainASH, AMANDA A on: 06/13/2016 04:49 PM   Modules accepted: Orders

## 2016-06-14 LAB — GC/CHLAMYDIA PROBE AMP (~~LOC~~) NOT AT ARMC
CHLAMYDIA, DNA PROBE: NEGATIVE
Neisseria Gonorrhea: NEGATIVE

## 2018-07-08 ENCOUNTER — Encounter (HOSPITAL_COMMUNITY): Payer: Self-pay

## 2018-07-08 ENCOUNTER — Ambulatory Visit (HOSPITAL_COMMUNITY)
Admission: EM | Admit: 2018-07-08 | Discharge: 2018-07-08 | Disposition: A | Discharge status: Home / Self Care | Payer: BLUE CROSS/BLUE SHIELD | Attending: Family Medicine | Admitting: Family Medicine

## 2018-07-08 DIAGNOSIS — R10A1 Flank pain, right side: Secondary | ICD-10-CM

## 2018-07-08 DIAGNOSIS — R109 Unspecified abdominal pain: Secondary | ICD-10-CM

## 2018-07-08 DIAGNOSIS — R05 Cough: Secondary | ICD-10-CM

## 2018-07-08 DIAGNOSIS — R059 Cough, unspecified: Secondary | ICD-10-CM

## 2018-07-08 MED ORDER — NAPROXEN 375 MG PO TABS: 375.0000 mg | ORAL_TABLET | Freq: Two times a day (BID) | ORAL | 0 refills | Status: AC

## 2018-07-08 MED ORDER — BENZONATATE 100 MG PO CAPS: 100.0000 mg | ORAL_CAPSULE | Freq: Three times a day (TID) | ORAL | 0 refills | Status: AC

## 2018-07-08 MED ORDER — NAPROXEN 375 MG PO TABS: 375.0000 mg | ORAL_TABLET | Freq: Two times a day (BID) | ORAL | 0 refills | Status: DC

## 2018-07-08 MED ORDER — BENZONATATE 100 MG PO CAPS: 100.0000 mg | ORAL_CAPSULE | Freq: Three times a day (TID) | ORAL | 0 refills | Status: DC

## 2018-07-08 NOTE — Discharge Instructions (Signed)
Please use Tessalon for cough every 8 hours  Please use Naprosyn twice daily with food on your stomach for management of slight discomfort  Please follow-up if cough worsening, persisting, developing shortness of breath, chest discomfort, leg swelling, leg pain

## 2018-07-08 NOTE — ED Triage Notes (Signed)
Pt states she has flank pain when she coughs. X 2 days

## 2018-07-08 NOTE — ED Provider Notes (Signed)
[facility]    CSN: [id number] Arrival date & time: 07/08/18  1356     History   Chief Complaint Chief Complaint  Patient presents with  . Cough  . Flank Pain    HPI Kathy Campbell is a 47 y.o. female no contributing past medical history presenting today for evaluation of right side pain as well as a cough.  Patient states that 2 days ago she woke up with a discomfort in her right side, the following day she developed a frequent dry cough.  States that the pain worsens with coughing, deep breaths or sleeping on this side.  She denies associated URI symptoms of congestion, sore throat.  She has had occasional sneezing.  Denies fevers.  States that this feels very similar to when she has had previous summer colds.  Denies previous DVT/PE.  Denies recent travel, immobilization, surgery or hospitalization.  Denies any tobacco use, does use marijuana occasionally.  Denies vaping.  She has tried Mucinex with mild relief.  HPI  History reviewed. No pertinent past medical history.  There are no active problems to display for this patient.   Past Surgical History:  Procedure Laterality Date  . CESAREAN SECTION  1991    OB History    Gravida  2   Para  1   Term  1   Preterm      AB  1   Living        SAB      TAB      Ectopic      Multiple      Live Births               Home Medications    Prior to Admission medications   Medication Sig Start Date End Date Taking? Authorizing Provider  benzonatate (TESSALON) 100 MG capsule Take 1 capsule (100 mg total) by mouth every 8 (eight) hours. 07/08/18   Leeasia Secrist C, PA-C  naproxen (NAPROSYN) 375 MG tablet Take 1 tablet (375 mg total) by mouth 2 (two) times daily. 07/08/18   Fredrik Mogel, Junius Creamer, PA-C    Family History History reviewed. No pertinent family history.  Social History Social History   Tobacco Use  . Smoking status: Current Every Day Smoker    Packs/day: 0.05    Types: Cigarettes    . Smokeless tobacco: Never Used  Substance Use Topics  . Alcohol use: Not on file  . Drug use: No     Allergies   Patient has no known allergies.   Review of Systems Review of Systems  Constitutional: Negative for activity change, appetite change, chills, fatigue and fever.  HENT: Negative for congestion, ear pain, rhinorrhea, sinus pressure, sore throat and trouble swallowing.   Eyes: Negative for discharge and redness.  Respiratory: Positive for cough. Negative for chest tightness and shortness of breath.   Cardiovascular: Negative for chest pain.  Gastrointestinal: Negative for abdominal pain, diarrhea, nausea and vomiting.  Genitourinary: Positive for flank pain. Negative for dysuria and hematuria.  Musculoskeletal: Negative for myalgias.  Skin: Negative for rash.  Neurological: Negative for dizziness, light-headedness and headaches.     Physical Exam Triage Vital Signs ED Triage Vitals  Enc Vitals Group     BP 07/08/18 1439 (!) 160/88     Pulse Rate 07/08/18 1439 60     Resp 07/08/18 1439 18     Temp 07/08/18 1439 97.9 F (36.6 C)     Temp Source 07/08/18 1439 Oral  SpO2 07/08/18 1439 100 %     Weight 07/08/18 1440 201 lb (91.2 kg)     Height --      Head Circumference --      Peak Flow --      Pain Score --      Pain Loc --      Pain Edu? --      Excl. in GC? --    No data found.  Updated Vital Signs BP (!) 160/88 (BP Location: Right Arm)   Pulse 60   Temp 97.9 F (36.6 C) (Oral)   Resp 18   Wt 201 lb (91.2 kg)   SpO2 100%   BMI 37.98 kg/m   Visual Acuity Right Eye Distance:   Left Eye Distance:   Bilateral Distance:    Right Eye Near:   Left Eye Near:    Bilateral Near:     Physical Exam  Constitutional: She appears well-developed and well-nourished. No distress.  HENT:  Head: Normocephalic and atraumatic.  Bilateral ears without tenderness to palpation of external auricle, tragus and mastoid, EAC's without erythema or swelling, TM's  with good bony landmarks and cone of light. Non erythematous.  Oral mucosa pink and moist, no tonsillar enlargement or exudate. Posterior pharynx patent and nonerythematous, no uvula deviation or swelling. Normal phonation.  Eyes: Conjunctivae are normal.  Neck: Neck supple.  Cardiovascular: Normal rate and regular rhythm.  No murmur heard. Pulmonary/Chest: Effort normal and breath sounds normal. No respiratory distress.  Breathing comfortably at rest, CTABL, no wheezing, rales or other adventitious sounds auscultated  Abdominal: Soft. There is no tenderness.  Nontender to light deep palpation throughout all 4 quadrants, no CVA tenderness  Musculoskeletal: She exhibits no edema.  Right side discomfort not reproducible on exam  Neurological: She is alert.  Skin: Skin is warm and dry.  Psychiatric: She has a normal mood and affect.  Nursing note and vitals reviewed.    UC Treatments / Results  Labs (all labs ordered are listed, but only abnormal results are displayed) Labs Reviewed - No data to display  EKG None  Radiology No results found.  Procedures Procedures (including critical care time)  Medications Ordered in UC Medications - No data to display  Initial Impression / Assessment and Plan / UC Course  I have reviewed the triage vital signs and the nursing notes.  Pertinent labs & imaging results that were available during my care of the patient were reviewed by me and considered in my medical decision making (see chart for details).     Patient with dry cough and chest discomfort.  Patient is PERC negative, she denies tobacco use, but has smoking listed in her past medical history on her chart, but she declines that she has not used cigarettes or other tobacco products since her teens.  No fever, no tachycardia, OT stable.  Will treat cough and discomfort as viral and muscular etiology versus pleuritis, patient is low risk for PE, discussed this with patient and advised  her to go to emergency room if having worsening discomfort or developing shortness of breath or chest pain.  Offered chest x-ray, but patient opted to defer and try symptomatic management first.  Treating with Tessalon since her blood pressure is elevated, Naprosyn for discomfort.Discussed strict return precautions. Patient verbalized understanding and is agreeable with plan.  Final Clinical Impressions(s) / UC Diagnoses   Final diagnoses:  Cough  Right flank pain     Discharge Instructions  Please use Tessalon for cough every 8 hours  Please use Naprosyn twice daily with food on your stomach for management of slight discomfort  Please follow-up if cough worsening, persisting, developing shortness of breath, chest discomfort, leg swelling, leg pain   ED Prescriptions    Medication Sig Dispense Auth. Provider   benzonatate (TESSALON) 100 MG capsule  (Status: Discontinued) Take 1 capsule (100 mg total) by mouth every 8 (eight) hours. 21 capsule Colby Catanese C, PA-C   naproxen (NAPROSYN) 375 MG tablet  (Status: Discontinued) Take 1 tablet (375 mg total) by mouth 2 (two) times daily. 20 tablet Tatyana Biber C, PA-C   benzonatate (TESSALON) 100 MG capsule Take 1 capsule (100 mg total) by mouth every 8 (eight) hours. 21 capsule Tashima Scarpulla C, PA-C   naproxen (NAPROSYN) 375 MG tablet Take 1 tablet (375 mg total) by mouth 2 (two) times daily. 20 tablet Norman Piacentini, Costilla C, PA-C     Controlled Substance Prescriptions [place] Controlled Substance Registry consulted? Not Applicable   Lew Dawes, [place] 07/08/18 1524

## 2018-11-30 ENCOUNTER — Encounter (HOSPITAL_COMMUNITY): Payer: Self-pay | Admitting: *Deleted

## 2018-11-30 ENCOUNTER — Ambulatory Visit (HOSPITAL_COMMUNITY)
Admission: EM | Admit: 2018-11-30 | Discharge: 2018-11-30 | Disposition: A | Discharge status: Home / Self Care | Payer: BLUE CROSS/BLUE SHIELD | Attending: Physician Assistant | Admitting: Physician Assistant

## 2018-11-30 DIAGNOSIS — M25551 Pain in right hip: Secondary | ICD-10-CM | POA: Diagnosis not present

## 2018-11-30 MED ORDER — MELOXICAM 15 MG PO TABS: 7.5000 mg | ORAL_TABLET | Freq: Every day | ORAL | 0 refills | Status: AC

## 2018-11-30 NOTE — ED Triage Notes (Signed)
Reports waking up with pain to right hip, somewhat into right groin area 3 days ago without known injury.

## 2018-11-30 NOTE — ED Provider Notes (Signed)
11/30/2018 5:28 PM   DOB: January 13, 1971 / MRN: [id number]  SUBJECTIVE:  Kathy Campbell is a 48 y.o. female presenting for right-sided hip pain that started 3 days ago and is worse with ambulation.  Patient reports the pain resolves with resting.  Has not tried any meds yet.   has No Known Allergies.   She  has no past medical history on file.    She  reports that she has been smoking cigarettes. She has been smoking about 0.05 packs per day. She has never used smokeless tobacco. She reports current alcohol use of about 15.0 standard drinks of alcohol per week. She reports that she does not use drugs. She  reports being sexually active. She reports using the following method of birth control/protection: None. The patient  has a past surgical history that includes Cesarean section (1991).  Her family history includes Healthy in her father and mother.  Review of Systems  Constitutional: Negative for diaphoresis.  Respiratory: Negative for cough, hemoptysis, sputum production, shortness of breath and wheezing.   Cardiovascular: Negative for chest pain, orthopnea and leg swelling.  Gastrointestinal: Negative for nausea.  Neurological: Negative for dizziness.    OBJECTIVE:  BP (!) 145/93 (BP Location: Left Arm)   Pulse 64   Temp 98.5 F (36.9 C) (Oral)   Resp 18   SpO2 95%   Wt Readings from Last 3 Encounters:  07/08/18 201 lb (91.2 kg)  06/13/16 207 lb 8 oz (94.1 kg)   Temp Readings from Last 3 Encounters:  11/30/18 98.5 F (36.9 C) (Oral)  07/08/18 97.9 F (36.6 C) (Oral)  01/10/15 97.9 F (36.6 C) (Oral)   BP Readings from Last 3 Encounters:  11/30/18 (!) 145/93  07/08/18 (!) 160/88  06/13/16 (!) 134/99   Pulse Readings from Last 3 Encounters:  11/30/18 64  07/08/18 60  06/13/16 62    Physical Exam Vitals signs reviewed.  Constitutional:      General: She is not in acute distress.    Appearance: She is not diaphoretic.  Eyes:     Pupils: Pupils are equal,  round, and reactive to light.  Cardiovascular:     Rate and Rhythm: Normal rate.  Pulmonary:     Effort: Pulmonary effort is normal.  Abdominal:     General: There is no distension.  Skin:    General: Skin is dry.  Neurological:     Mental Status: She is alert and oriented to person, place, and time.     Cranial Nerves: No cranial nerve deficit.     Gait: Gait normal.  Psychiatric:        Mood and Affect: Mood normal.        Behavior: Behavior normal.     No results found for this or any previous visit (from the past 72 hour(s)).  No results found.  ASSESSMENT AND PLAN:   Right hip pain - She has a positive acetabular grind test on the right.  No other motions aside from weightbearing seem to exacerbate the pain.  I think she probably has some mild arthritis in the joint.  I will prescribe an anti-inflammatory and have asked that she get set up with a primary care provider so that this problem can be followed.  Discharge Instructions   None        The patient is advised to call or return to clinic if she does not see an improvement in symptoms, or to seek the care of the closest emergency  department if she worsens with the above plan.   Deliah BostonMichael Osiris Charles, MHS, PA-C 11/30/2018 5:28 PM   Ofilia Neaslark, Lalla Laham L, PA-C 11/30/18 1728

## 2018-11-30 NOTE — Discharge Instructions (Signed)
Please give a call to Dr. Creta Levin and get set up to see her in a few weeks.  The pain is still there I think x-rays would definitely be warranted.

## 2020-01-21 ENCOUNTER — Ambulatory Visit: Payer: BLUE CROSS/BLUE SHIELD | Attending: Internal Medicine

## 2020-01-21 DIAGNOSIS — Z23 Encounter for immunization: Secondary | ICD-10-CM

## 2020-01-21 NOTE — Progress Notes (Signed)
   Covid-19 Vaccination Clinic  Name:  Kathy Campbell    MRN: [id number] DOB: November 22, 1970  01/21/2020  [name] was observed post Covid-19 immunization for 15 minutes without incident. She was provided with Vaccine Information Sheet and instruction to access the V-Safe system.   [name] was instructed to call 911 with any severe reactions post vaccine: Marland Kitchen Difficulty breathing  . Swelling of face and throat  . A fast heartbeat  . A bad rash all over body  . Dizziness and weakness   Immunizations Administered    Name Date Dose VIS Date Route   Pfizer COVID-19 Vaccine 01/21/2020  8:51 AM 0.3 mL 10/16/2019 Intramuscular   Manufacturer: [employer], [employer]   Lot: [id number]   NDC: [address]-8

## 2020-01-25 ENCOUNTER — Ambulatory Visit: Payer: BLUE CROSS/BLUE SHIELD

## 2020-02-15 ENCOUNTER — Ambulatory Visit: Payer: BLUE CROSS/BLUE SHIELD | Attending: Internal Medicine

## 2020-02-15 DIAGNOSIS — Z23 Encounter for immunization: Secondary | ICD-10-CM

## 2020-02-15 NOTE — Progress Notes (Signed)
   Covid-19 Vaccination Clinic  Name:  Kathy Campbell    MRN: [id number] DOB: [birth year]  02/15/2020  Kathy Campbell was observed post Covid-19 immunization for 15 minutes without incident. She was provided with Vaccine Information Sheet and instruction to access the V-Safe system.   Kathy Campbell was instructed to call 911 with any severe reactions post vaccine: Marland Kitchen Difficulty breathing  . Swelling of face and throat  . A fast heartbeat  . A bad rash all over body  . Dizziness and weakness   Immunizations Administered    Name Date Dose VIS Date Route   Pfizer COVID-19 Vaccine 02/15/2020  8:32 AM 0.3 mL 10/16/2019 Intramuscular   Manufacturer: [employer], [employer]   Lot: [id number]   NDC: [address]-7

## 2022-10-08 ENCOUNTER — Ambulatory Visit
Admission: EM | Admit: 2022-10-08 | Discharge: 2022-10-08 | Discharge status: Absent without leave | Payer: BLUE CROSS/BLUE SHIELD

## 2022-10-09 ENCOUNTER — Ambulatory Visit
Admission: RE | Admit: 2022-10-09 | Discharge: 2022-10-09 | Disposition: A | Discharge status: Home / Self Care | Payer: BLUE CROSS/BLUE SHIELD | Source: Ambulatory Visit

## 2022-10-09 VITALS — BP 131/91 | HR 64 | Temp 98.1°F | Resp 19

## 2022-10-09 DIAGNOSIS — J019 Acute sinusitis, unspecified: Secondary | ICD-10-CM | POA: Diagnosis not present

## 2022-10-09 DIAGNOSIS — J029 Acute pharyngitis, unspecified: Secondary | ICD-10-CM

## 2022-10-09 MED ORDER — LIDOCAINE VISCOUS HCL 2 % MT SOLN: 5.0000 mL | Freq: Three times a day (TID) | OROMUCOSAL | 0 refills | Status: AC | PRN

## 2022-10-09 MED ORDER — AMOXICILLIN-POT CLAVULANATE 875-125 MG PO TABS: 1.0000 | ORAL_TABLET | Freq: Two times a day (BID) | ORAL | 0 refills | Status: AC

## 2022-10-09 NOTE — ED Provider Notes (Signed)
[facility]    CSN: [id number] Arrival date & time: 10/09/22  0855      History   Chief Complaint Chief Complaint  Patient presents with   Nasal Congestion   Sore    HPI Kathy Campbell is a 51 y.o. female.   Patient here today for evaluation of congestion and sore throat that started about a week ago.  She reports that she recently had suspected food poisoning with vomiting and diarrhea and since that time has developed sore throat.  She has not had fever.  She does report sinus congestion and pressure.  She has tried over-the-counter medication without resolution.  The history is provided by the patient.    History reviewed. No pertinent past medical history.  There are no problems to display for this patient.   Past Surgical History:  Procedure Laterality Date   CESAREAN SECTION  1991    OB History     Gravida  2   Para  1   Term  1   Preterm      AB  1   Living         SAB      IAB      Ectopic      Multiple      Live Births               Home Medications    Prior to Admission medications   Medication Sig Start Date End Date Taking? Authorizing Provider  amoxicillin-clavulanate (AUGMENTIN) 875-125 MG tablet Take 1 tablet by mouth every 12 (twelve) hours. 10/09/22  Yes Francene Finders, PA-C  hydrochlorothiazide (MICROZIDE) 12.5 MG capsule Take 1 capsule by mouth daily. 10/03/22 06/30/23 Yes [provider]  magic mouthwash (lidocaine, diphenhydrAMINE, alum & mag hydroxide) suspension Swish and swallow 5 mLs 3 (three) times daily as needed for up to 7 days for mouth pain. 10/09/22 10/16/22 Yes Francene Finders, PA-C  benzonatate (TESSALON) 100 MG capsule Take 1 capsule (100 mg total) by mouth every 8 (eight) hours. 07/08/18   Wieters, Hallie C, PA-C  naproxen (NAPROSYN) 375 MG tablet Take 1 tablet (375 mg total) by mouth 2 (two) times daily. 07/08/18   Wieters, Elesa Hacker, PA-C    Family History Family History   Problem Relation Age of Onset   Healthy Mother    Healthy Father     Social History Social History   Tobacco Use   Smoking status: Some Days    Packs/day: 0.05    Types: Cigarettes   Smokeless tobacco: Never  Vaping Use   Vaping Use: Never used  Substance Use Topics   Alcohol use: Yes    Alcohol/week: 15.0 standard drinks of alcohol    Types: 15 Glasses of wine per week   Drug use: Never     Allergies   Patient has no known allergies.   Review of Systems Review of Systems  Constitutional:  Negative for chills and fever.  HENT:  Positive for congestion, sinus pressure and sore throat. Negative for ear pain.   Eyes:  Negative for discharge and redness.  Respiratory:  Positive for cough. Negative for shortness of breath and wheezing.   Gastrointestinal:  Negative for abdominal pain, diarrhea, nausea and vomiting.     Physical Exam Triage Vital Signs ED Triage Vitals  Enc Vitals Group     BP 10/09/22 0909 (!) 131/91     Pulse Rate 10/09/22 0909 64     Resp 10/09/22  0909 19     Temp 10/09/22 0909 98.1 F (36.7 C)     Temp src --      SpO2 10/09/22 0909 98 %     Weight --      Height --      Head Circumference --      Peak Flow --      Pain Score 10/09/22 0907 7     Pain Loc --      Pain Edu? --      Excl. in GC? --    No data found.  Updated Vital Signs BP (!) 131/91   Pulse 64   Temp 98.1 F (36.7 C)   Resp 19   LMP 04/28/2016 (Exact Date)   SpO2 98%      Physical Exam Vitals and nursing note reviewed.  Constitutional:      General: She is not in acute distress.    Appearance: Normal appearance. She is not ill-appearing.  HENT:     Head: Normocephalic and atraumatic.     Nose: Congestion present.     Mouth/Throat:     Mouth: Mucous membranes are moist.     Pharynx: Posterior oropharyngeal erythema present. No oropharyngeal exudate.  Eyes:     Conjunctiva/sclera: Conjunctivae normal.  Cardiovascular:     Rate and Rhythm: Normal rate and  regular rhythm.     Heart sounds: Normal heart sounds. No murmur heard. Pulmonary:     Effort: Pulmonary effort is normal. No respiratory distress.     Breath sounds: Normal breath sounds. No wheezing, rhonchi or rales.  Skin:    General: Skin is warm and dry.  Neurological:     Mental Status: She is alert.  Psychiatric:        Mood and Affect: Mood normal.        Thought Content: Thought content normal.      UC Treatments / Results  Labs (all labs ordered are listed, but only abnormal results are displayed) Labs Reviewed - No data to display  EKG   Radiology No results found.  Procedures Procedures (including critical care time)  Medications Ordered in UC Medications - No data to display  Initial Impression / Assessment and Plan / UC Course  I have reviewed the triage vital signs and the nursing notes.  Pertinent labs & imaging results that were available during my care of the patient were reviewed by me and considered in my medical decision making (see chart for details).    Suspect likely sinusitis development given duration of symptoms.  Will treat with Augmentin and recommended follow-up if no gradual improvement.  Magic mouthwash prescribed to hopefully help with pharyngitis.    Final Clinical Impressions(s) / UC Diagnoses   Final diagnoses:  Acute sinusitis, recurrence not specified, unspecified location  Acute pharyngitis, unspecified etiology   Discharge Instructions   None    ED Prescriptions     Medication Sig Dispense Auth. Provider   amoxicillin-clavulanate (AUGMENTIN) 875-125 MG tablet Take 1 tablet by mouth every 12 (twelve) hours. 14 tablet Erma Pinto F, PA-C   magic mouthwash (lidocaine, diphenhydrAMINE, alum & mag hydroxide) suspension Swish and swallow 5 mLs 3 (three) times daily as needed for up to 7 days for mouth pain. 360 mL Tomi Bamberger, PA-C      PDMP not reviewed this encounter.   Tomi Bamberger, PA-C 10/09/22 1318

## 2022-10-09 NOTE — ED Triage Notes (Signed)
Pt presents to uc with co of congestion and sore throat. Pt reports she was sick with food poisoning one week ago and every since vomiting she has had bad sore throat.
